# Patient Record
Sex: Female | Born: 1975 | Race: White | Hispanic: No | Marital: Married | Smoking: Never smoker
Health system: Southern US, Community
[De-identification: ages and names within clinical notes are randomized; demographics above are authoritative.]

## PROBLEM LIST (undated history)

## (undated) DIAGNOSIS — Z973 Presence of spectacles and contact lenses: Secondary | ICD-10-CM

## (undated) HISTORY — PX: WISDOM TOOTH EXTRACTION: SHX21

## (undated) HISTORY — DX: Presence of spectacles and contact lenses: Z97.3

## (undated) HISTORY — PX: DEBRIDEMENT AND CLOSURE WOUND: SHX5614

---

## 2000-02-07 ENCOUNTER — Other Ambulatory Visit: Admission: RE | Admit: 2000-02-07 | Discharge: 2000-02-07 | Payer: Self-pay | Admitting: Gynecology

## 2001-02-19 ENCOUNTER — Other Ambulatory Visit: Admission: RE | Admit: 2001-02-19 | Discharge: 2001-02-19 | Payer: Self-pay | Admitting: Gynecology

## 2002-03-03 ENCOUNTER — Other Ambulatory Visit: Admission: RE | Admit: 2002-03-03 | Discharge: 2002-03-03 | Payer: Self-pay | Admitting: Gynecology

## 2003-03-17 ENCOUNTER — Other Ambulatory Visit: Admission: RE | Admit: 2003-03-17 | Discharge: 2003-03-17 | Payer: Self-pay | Admitting: Gynecology

## 2004-03-20 ENCOUNTER — Other Ambulatory Visit: Admission: RE | Admit: 2004-03-20 | Discharge: 2004-03-20 | Payer: Self-pay | Admitting: Gynecology

## 2005-03-28 ENCOUNTER — Other Ambulatory Visit: Admission: RE | Admit: 2005-03-28 | Discharge: 2005-03-28 | Payer: Self-pay | Admitting: Gynecology

## 2005-08-21 ENCOUNTER — Other Ambulatory Visit: Admission: RE | Admit: 2005-08-21 | Discharge: 2005-08-21 | Payer: Self-pay | Admitting: Gynecology

## 2006-03-19 ENCOUNTER — Other Ambulatory Visit: Admission: RE | Admit: 2006-03-19 | Discharge: 2006-03-19 | Payer: Self-pay | Admitting: Gynecology

## 2006-12-20 ENCOUNTER — Ambulatory Visit (HOSPITAL_COMMUNITY): Admission: RE | Admit: 2006-12-20 | Discharge: 2006-12-20 | Payer: Self-pay | Admitting: Gynecology

## 2007-05-07 ENCOUNTER — Other Ambulatory Visit: Admission: RE | Admit: 2007-05-07 | Discharge: 2007-05-07 | Payer: Self-pay | Admitting: Gynecology

## 2008-02-05 ENCOUNTER — Ambulatory Visit (HOSPITAL_COMMUNITY): Admission: RE | Admit: 2008-02-05 | Discharge: 2008-02-05 | Payer: Self-pay | Admitting: Gynecology

## 2008-06-01 ENCOUNTER — Other Ambulatory Visit: Admission: RE | Admit: 2008-06-01 | Discharge: 2008-06-01 | Payer: Self-pay | Admitting: Gynecology

## 2009-07-06 ENCOUNTER — Inpatient Hospital Stay (HOSPITAL_COMMUNITY): Admission: AD | Admit: 2009-07-06 | Discharge: 2009-07-09 | Payer: Self-pay | Admitting: Obstetrics & Gynecology

## 2009-07-21 ENCOUNTER — Ambulatory Visit (HOSPITAL_COMMUNITY): Admission: AD | Admit: 2009-07-21 | Discharge: 2009-07-21 | Payer: Self-pay | Admitting: Obstetrics and Gynecology

## 2009-10-20 IMAGING — RF DG HYSTEROGRAM
7 series · 7 of 7 positions shown · non-contrast
Comparison: none

CLINICAL DATA: Infertility.

HYSTEROSALPINGOGRAM
TECHNIQUE: Hysterosalpingogram was performed by the ordering
physician under fluoroscopy.  Fluoroscopic images are submitted for
interpretation following the procedure.
Fluoroscopy time:  1.3 minutes

[Series 1: run · 1 of 1 slices shown (1 of 7)]
[im 1/1]
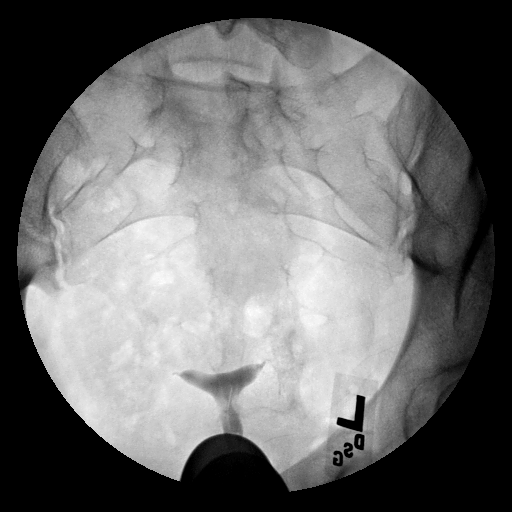

[Series 2: run · 1 of 1 slices shown (2 of 7)]
[im 1/1]
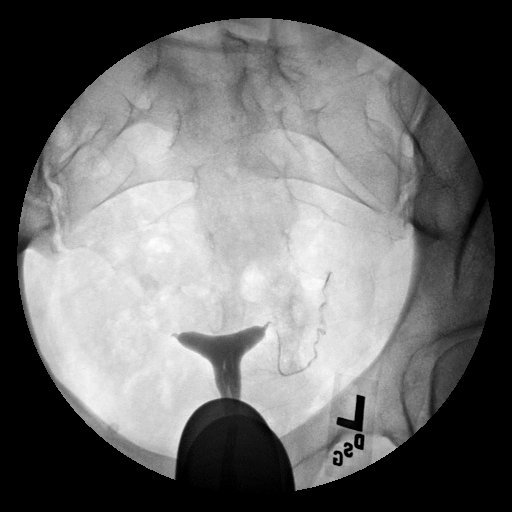

[Series 3: run · 1 of 1 slices shown (3 of 7)]
[im 1/1]
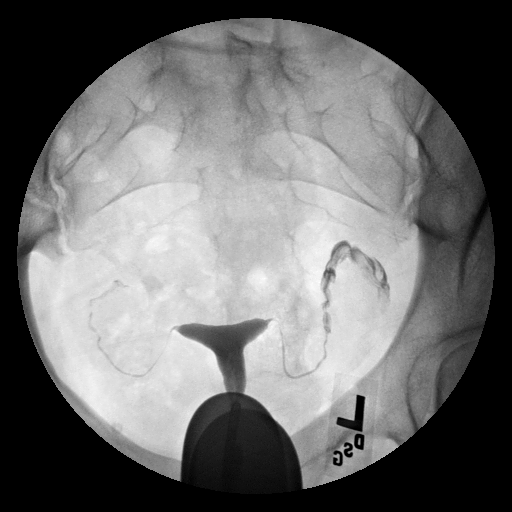

[Series 4: run · 1 of 1 slices shown (4 of 7)]
[im 1/1]
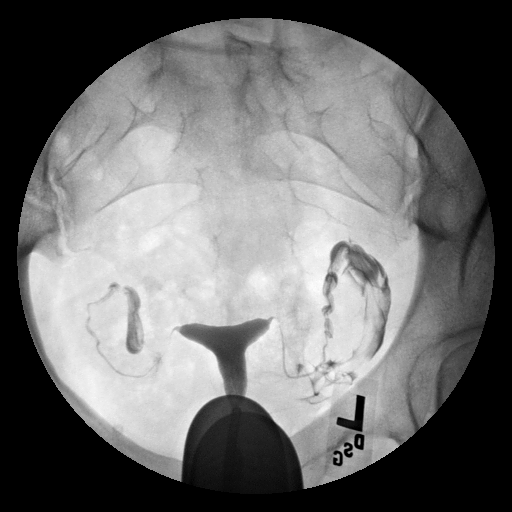

[Series 5: run · 1 of 1 slices shown (5 of 7)]
[im 1/1]
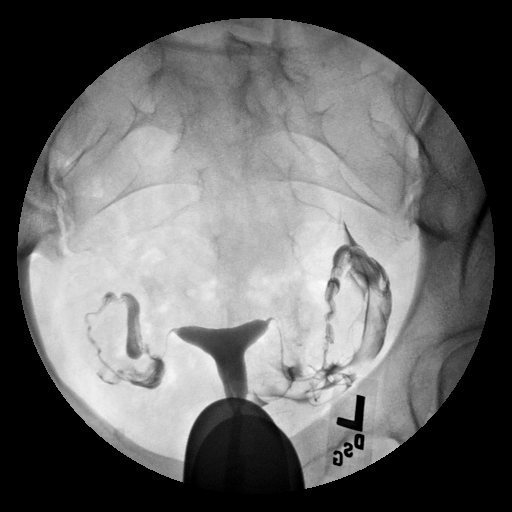

[Series 6: run · 1 of 1 slices shown (6 of 7)]
[im 1/1]
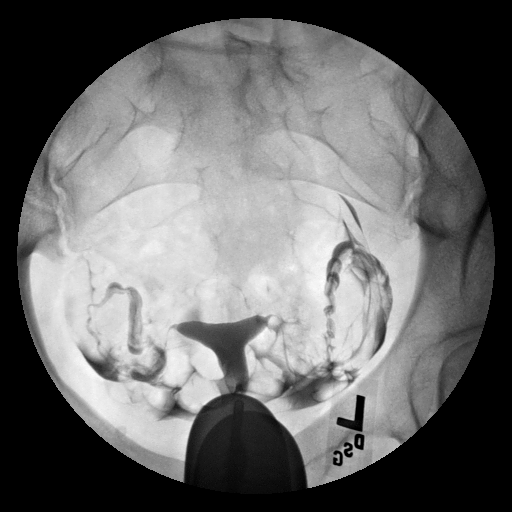

[Series 7: run · 1 of 1 slices shown (7 of 7)]
[im 1/1]
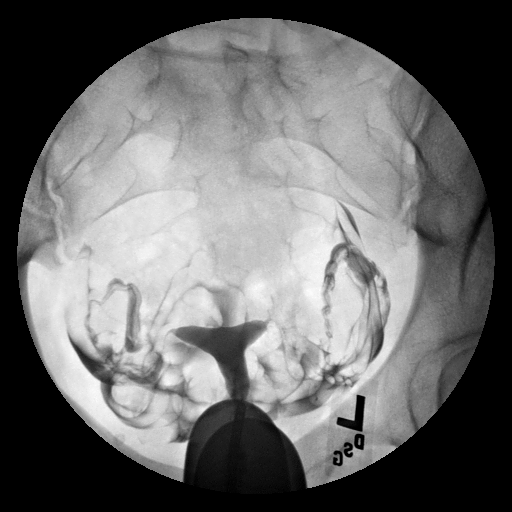

[7 of 7 positions shown; findings below may reference images not displayed]

FINDINGS: A normal endometrial morphology is noted.  Both
fallopian tubes demonstrate a normal morphology and bilateral free
intraperitoneal spill is noted.  No evidence for loculation of
contrast is seen within the pelvis to suggest the presence of
peritubal or periovarian adhesions.
IMPRESSION: Normal HSG.

## 2010-09-17 LAB — CBC
HCT: 36.3 % (ref 36.0–46.0)
Hemoglobin: 12.1 g/dL (ref 12.0–15.0)
MCHC: 34.3 g/dL (ref 30.0–36.0)
MCV: 84.4 fL (ref 78.0–100.0)
RBC: 4.29 MIL/uL (ref 3.87–5.11)
RBC: 4.32 MIL/uL (ref 3.87–5.11)
RDW: 16.6 % — ABNORMAL HIGH (ref 11.5–15.5)
WBC: 11.1 10*3/uL — ABNORMAL HIGH (ref 4.0–10.5)
WBC: 18.6 10*3/uL — ABNORMAL HIGH (ref 4.0–10.5)

## 2010-09-17 LAB — TYPE AND SCREEN: ABO/RH(D): A POS

## 2011-04-03 ENCOUNTER — Encounter: Payer: Self-pay | Admitting: Vascular Surgery

## 2011-04-05 IMAGING — US US PELVIS COMPLETE
1 series · 13 of 25 positions shown · non-contrast
Comparison: None.

CLINICAL DATA: 3 days postpartum; persistent vaginal bleeding.

TRANSABDOMINAL ULTRASOUND OF PELVIS
TECHNIQUE: Transabdominal ultrasound examination of the pelvis was
performed including evaluation of the uterus, ovaries, adnexal
regions, and pelvic cul-de-sac.

[Series 1: us pelvis complete · 0.22mm/px · 13 of 39 slices shown]
[im 1/39]
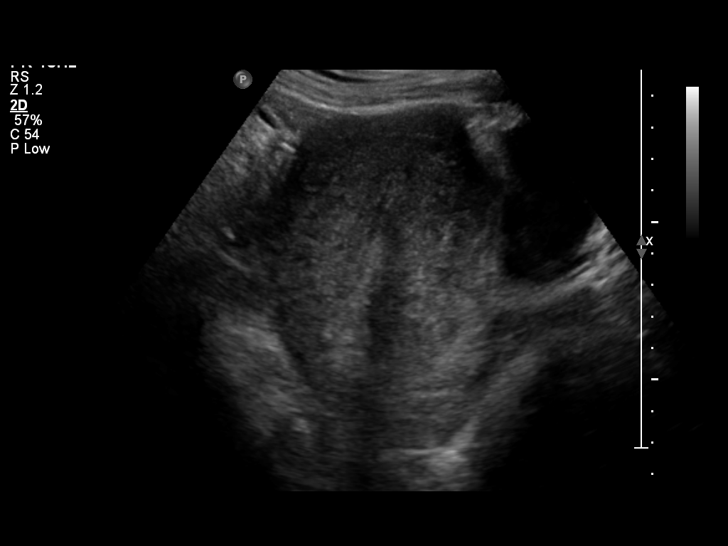
[im 4/39]
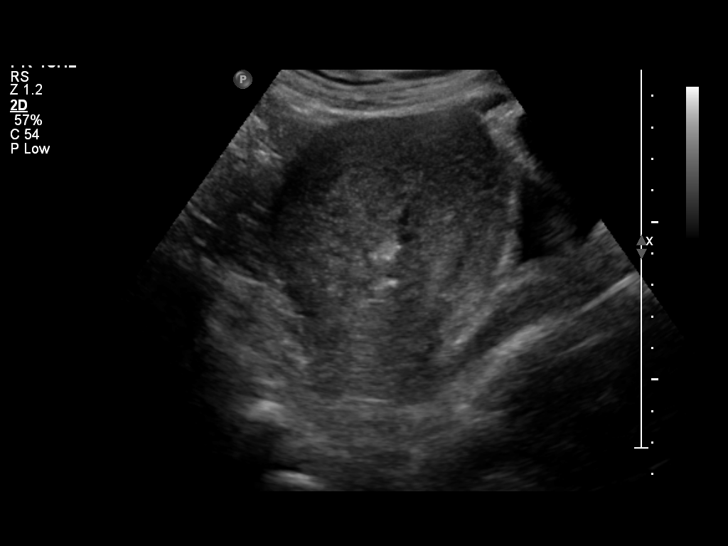
[im 7/39]
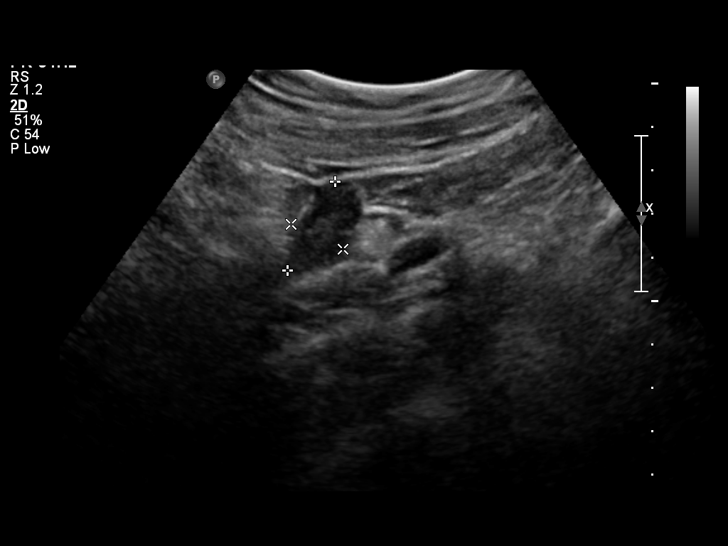
[im 10/39]
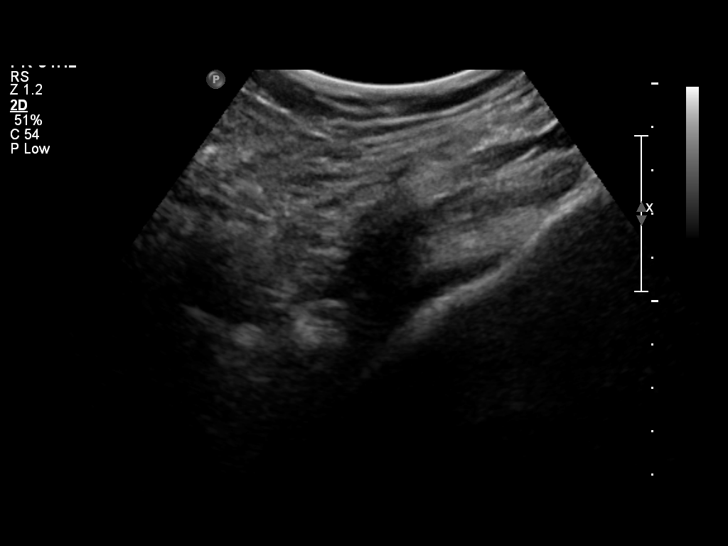
[im 13/39]
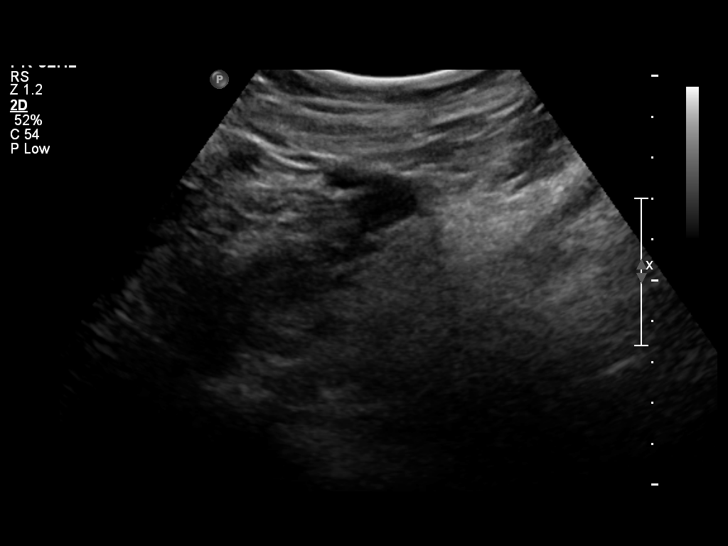
[im 16/39]
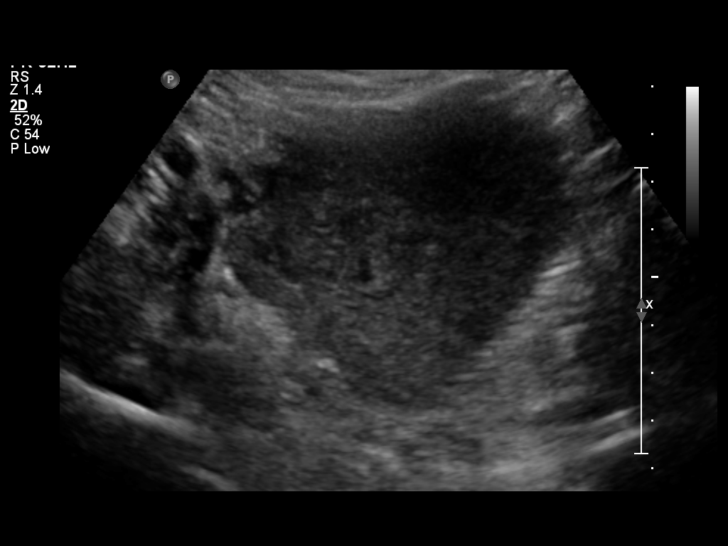
[im 20/39]
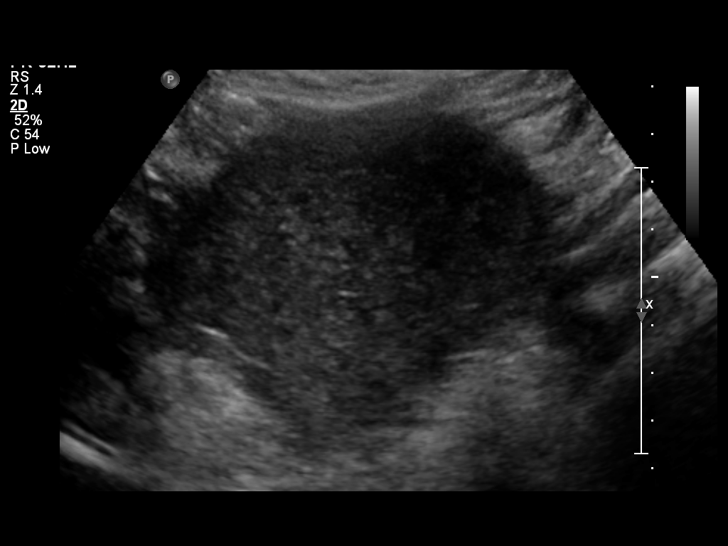
[im 23/39]
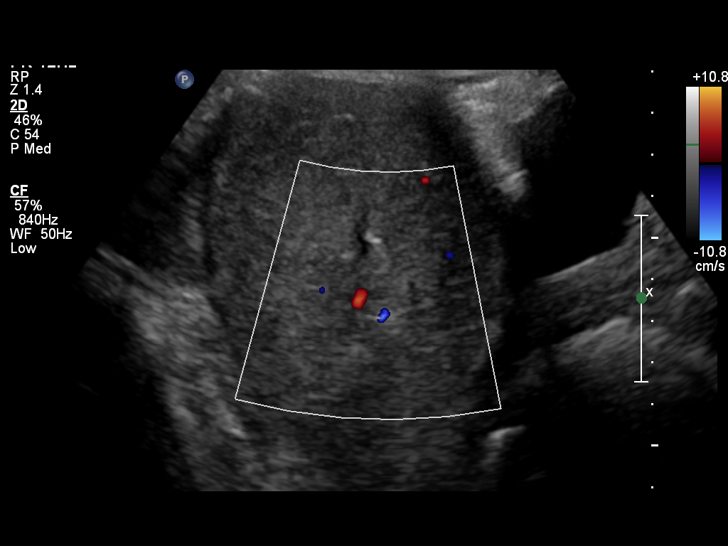
[im 26/39]
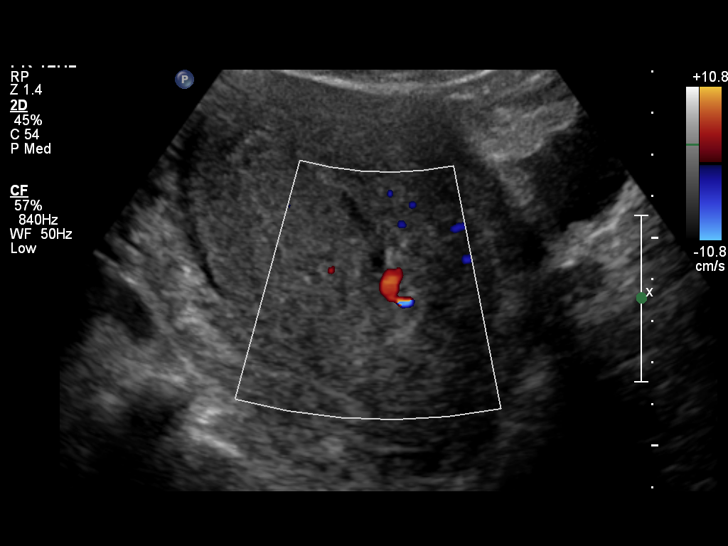
[im 29/39]
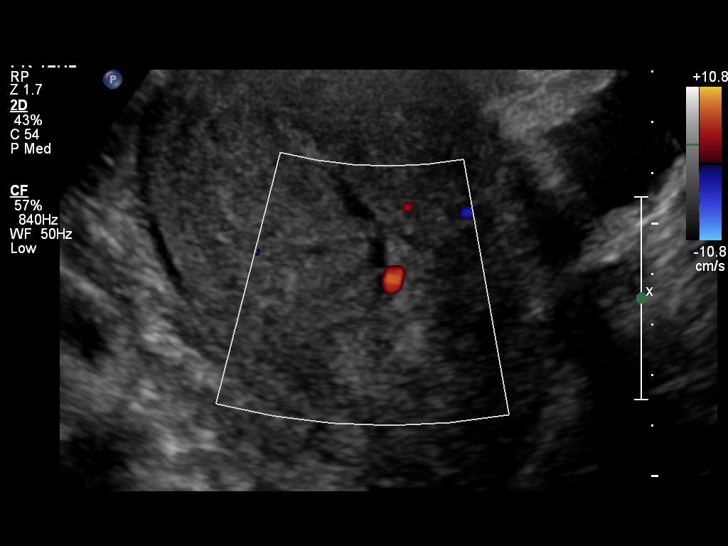
[im 32/39]
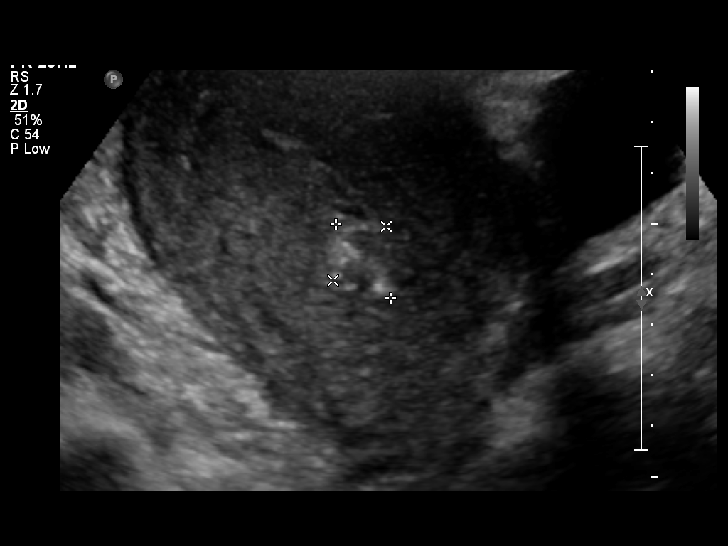
[im 35/39]
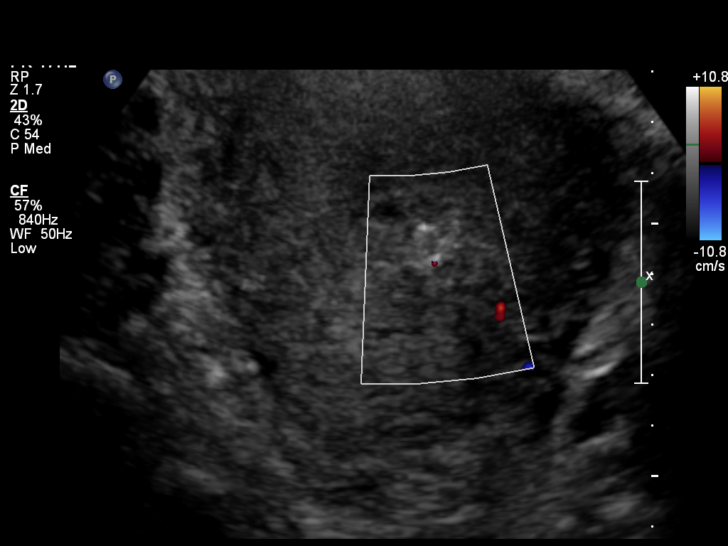
[im 39/39]
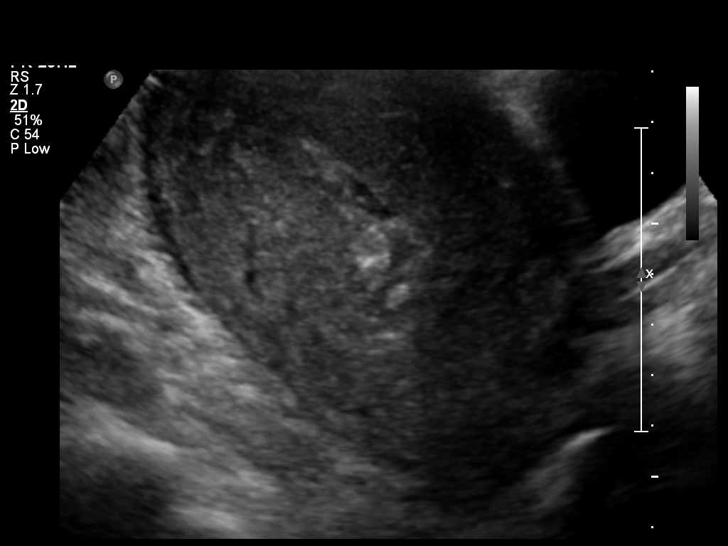

[13 of 25 positions shown; findings below may reference images not displayed]

FINDINGS: The uterus is diffusely enlarged, reflecting the patient's
postpartum state. It measures 10.5 cm in length, 7.2 cm in AP
dimension and 7.2 cm in transverse dimension.  Within the lower
body of the uterus, there is a heterogeneous mildly hyperechoic
mass measuring 1.8 x 1.5 x 1.4 cm, with associated blood flow,
compatible with retained products of conception.

The endometrial canal is otherwise unremarkable in appearance.  No
free fluid is seen within the endometrial canal or cervix.

The ovaries are unremarkable in appearance.  The right ovary
measures 2.7 x 1.2 x 0.9 cm, while the left ovary measures 2.3 x
1.3 x 1.1 cm.

A trace amount of free fluid is noted within the pelvic cul-de-sac.
IMPRESSION: 1.  Small amount of retained products of conception noted within
the body of the uterus, measuring 1.8 x 1.5 x 1.4 cm, with
associated blood flow.
2.  Otherwise unremarkable pelvic ultrasound.

[facility]

## 2011-04-18 LAB — HEMOGLOBINOPATHY EVALUATION
Hemoglobin Other: 0 (ref 0.0–0.0)
Hgb A: 97.6 %
Hgb F Quant: 0 (ref 0.0–2.0)
Hgb S Quant: 0 % (ref 0.0–0.0)

## 2011-04-18 LAB — DIFFERENTIAL
Basophils Absolute: 0
Eosinophils Absolute: 0.1
Eosinophils Relative: 1
Lymphocytes Relative: 24
Lymphs Abs: 2.1
Monocytes Absolute: 0.7
Neutro Abs: 5.9
Neutrophils Relative %: 67

## 2011-04-18 LAB — CBC
HCT: 45
MCHC: 33.4
MCV: 84.9
WBC: 8.7

## 2011-06-13 ENCOUNTER — Ambulatory Visit (INDEPENDENT_AMBULATORY_CARE_PROVIDER_SITE_OTHER): Payer: BC Managed Care – PPO | Admitting: *Deleted

## 2011-06-13 DIAGNOSIS — I781 Nevus, non-neoplastic: Secondary | ICD-10-CM

## 2011-06-13 DIAGNOSIS — I83893 Varicose veins of bilateral lower extremities with other complications: Secondary | ICD-10-CM

## 2011-06-13 NOTE — Progress Notes (Signed)
Addended by: Clementeen Hoof on: 06/13/2011 02:49 PM   Modules accepted: Orders

## 2011-06-13 NOTE — Progress Notes (Signed)
X=.3% Sotradecol administered with a 27g butterfly.  Patient received a total of 3cc.  Cutaneous Laser:pulsed mode  2.8 watts 30 secs. .3 pulses   Total pulses: 161 Total energy 444  Total time:.04  Photos:no  Compression stockings applied: yes  Treated her tiny spiders. Tol well even though she "hates needles". Antic. Good results. Will follow as needed.

## 2011-11-20 ENCOUNTER — Encounter: Payer: Self-pay | Admitting: *Deleted

## 2011-11-21 ENCOUNTER — Ambulatory Visit: Payer: BC Managed Care – PPO | Admitting: *Deleted

## 2011-11-27 ENCOUNTER — Encounter: Payer: Self-pay | Admitting: *Deleted

## 2011-11-28 ENCOUNTER — Encounter: Payer: Self-pay | Admitting: *Deleted

## 2011-11-28 ENCOUNTER — Encounter: Payer: Self-pay | Admitting: Vascular Surgery

## 2011-11-28 ENCOUNTER — Ambulatory Visit (INDEPENDENT_AMBULATORY_CARE_PROVIDER_SITE_OTHER): Payer: BC Managed Care – PPO | Admitting: *Deleted

## 2011-11-28 DIAGNOSIS — I781 Nevus, non-neoplastic: Secondary | ICD-10-CM

## 2011-11-28 NOTE — Progress Notes (Signed)
X=.3& Sotradecol administered with a 27g butterfly.  Patient received a total of 4cc.  Pt's veins are tiny and difficult to inject. Encouraged her to wait next time until they are larger and so an entire syringe can be used. Ones previously injected are resolving. Will follow prn.  Photos: no time, she was late  Compression stockings applied: yes

## 2012-11-26 ENCOUNTER — Encounter: Payer: Self-pay | Admitting: Medical

## 2012-11-26 ENCOUNTER — Ambulatory Visit (INDEPENDENT_AMBULATORY_CARE_PROVIDER_SITE_OTHER): Payer: BC Managed Care – PPO | Admitting: Medical

## 2012-11-26 VITALS — BP 98/70 | HR 62 | Temp 98.1°F | Resp 16 | Wt 151.0 lb

## 2012-11-26 DIAGNOSIS — L259 Unspecified contact dermatitis, unspecified cause: Secondary | ICD-10-CM

## 2012-11-26 DIAGNOSIS — L500 Allergic urticaria: Secondary | ICD-10-CM

## 2012-11-26 MED ORDER — METHYLPREDNISOLONE 4 MG PO KIT: PACK | ORAL | Status: DC

## 2012-11-26 MED ORDER — TRIAMCINOLONE ACETONIDE 0.1 % EX CREA: TOPICAL_CREAM | Freq: Two times a day (BID) | CUTANEOUS | Status: DC

## 2012-11-26 NOTE — Progress Notes (Signed)
Subjective: Here as a new patient today.  Is usually healthy, but notes 6 day hx/o rash.  Only other medical care in last 2 years has been gynecologic exam yearly and urgent care visit 2 years ago for sinusitis.  Current rash started out as few patches of hives on left neck, but over the next few days it has spread to upper chest and right neck.  Had this one other time in remote past related to stress, lasted 5 min, but this has lasted 6 days.  She notes rash only on chest and neck, not on extremities or face.  She has been out in the yard playing with her son, may have rubbed against some limbs/leaves, but denies new soaps, foods, hygiene products, no other new chemical exposures.  Using some benadryl oral for the itching.  No other aggravating or relieving factors.    Past Medical History  Diagnosis Date  . Wears glasses    ROS as in subjective  Objective: Gen: wd, wn, nad Skin; neck bilat with a few linear and round patches of raised whealed rash with erythema, broad flat splotchy reticular erythematous patches a swell.  No other abnormal skin finding.   Assessment: Encounter Diagnoses  Name Primary?  . Contact dermatitis Yes  . Allergic urticaria    Plan: Discussed symptoms, exam findings.   Likely cause is contact dermatitis by plant.  Advised avoidance, begin triamcinolone topically and if worse or not improving in 2 days, begin Medrol Dosepak.  Discussed risks/benefits of steroid use.  Call/return if worse or not improving.

## 2013-12-06 ENCOUNTER — Ambulatory Visit (INDEPENDENT_AMBULATORY_CARE_PROVIDER_SITE_OTHER): Payer: BC Managed Care – PPO | Admitting: Family Medicine

## 2013-12-06 VITALS — BP 110/68 | HR 72 | Temp 98.4°F | Resp 18 | Ht 67.0 in | Wt 140.0 lb

## 2013-12-06 DIAGNOSIS — H109 Unspecified conjunctivitis: Secondary | ICD-10-CM

## 2013-12-06 MED ORDER — CIPROFLOXACIN HCL 0.3 % OP SOLN: OPHTHALMIC | Status: DC

## 2013-12-06 NOTE — Progress Notes (Addendum)
Subjective:    Patient ID: Catherine Oconnor, female    DOB: 1975-08-01, 38 y.o.   MRN: [id number]  Conjunctivitis  Associated symptoms include eye itching, eye discharge and eye redness. Pertinent negatives include no photophobia.   Chief Complaint  Patient presents with  . Conjunctivitis    woke up this morning with matted eyes with redness    This chart was scribed for [name], MD by Andrew Au, ED Scribe. This patient was seen in room 3 and the patient's care was started at 11:03 AM.  HPI Comments: Catherine Oconnor is a 38 y.o. female who presents to the [facility] complaining of left erythematous eye x this morning upon waking. She reports crust to left eye first thing this morning. At this time left eye is watery but states she has avoided touching eye . Pt reports cold symptoms during the week without effecting eyes. States her husband had erythematous eye yesterday. She denies visual disturbances. Pt states she only wears glasses while driving. She does not use eye drops. She denies any foreign body to left eye. Pt is allergic to penicillins. NKI.   Pt is an Conservation officer, [employer]   Right eye Left eye Both eyes  Without correction:     With correction: 20/15 20/20 20/15     Patient Active Problem List   Diagnosis Date Noted  . 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 Wears glasses    Past Surgical History  Procedure Laterality Date  . Wisdom tooth extraction    . Debridement and closure wound     Allergies  Allergen Reactions  . Penicillins    Prior to Admission medications   Medication Sig Start Date End Date Taking? Authorizing Provider  methylPREDNISolone (MEDROL, PAK,) 4 MG tablet follow package directions 11/26/12   Kermit Balo Tysinger, PA-C  triamcinolone cream (KENALOG) 0.1 % Apply topically 2 (two) times daily. 11/26/12   Jac Canavan, PA-C   History   Social History  . Marital Status:  Married    Spouse Name: N/A    Number of Children: N/A  . Years of Education: N/A   Occupational History  . Not on file.   Social History Main Topics  . Smoking status: Never Smoker   . Smokeless tobacco: Not on file  . Alcohol Use: Not on file  . Drug Use: Not on file  . Sexual Activity: Not on file   Other Topics Concern  . 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 Negative for photophobia and visual disturbance.  Genitourinary: Negative for dysuria.      Objective:   Physical Exam  Vitals reviewed. Constitutional: She is oriented to person, place, and time. She appears well-developed and well-nourished. No distress.  HENT:  Head: Normocephalic and atraumatic.  Right Ear: Hearing, tympanic membrane, external ear and ear canal normal.  Left Ear: Hearing, tympanic membrane, external ear and ear canal normal.  Nose: Nose normal.  Mouth/Throat: Oropharynx is clear and moist. No oropharyngeal exudate.  Eyes: Conjunctivae and EOM are normal. Pupils are equal, round, and reactive to light. Left eye exhibits chemosis ( medial greater than lateral).  On medial canthus there is a small amount of exudate. There is no exudate present on the eyelids.   Neck: No thyromegaly present.  Cardiovascular: Normal  rate, regular rhythm, normal heart sounds and intact distal pulses.   No murmur heard. Pulmonary/Chest: Effort normal and breath sounds normal. No respiratory distress. She has no wheezes. She has no rhonchi.  Lymphadenopathy:       Head (right side): No preauricular adenopathy present.       Head (left side): No preauricular adenopathy present.  Neurological: She is alert and oriented to person, place, and time.  Skin: Skin is warm and dry. No rash noted.  Psychiatric: She has a normal mood and affect. Her behavior is normal.       Assessment & Plan:   [name] is a 38  y.o. female Conjunctivitis, left eye - Plan: ciprofloxacin (CILOXAN) 0.3 % ophthalmic solution  Prior URI, sick contact with suspected viral conjunctivitis. Suspect same in her L eye, vision ok. No significant matting at this point, sx care and contact precautions at present, but if increased sx's or daytime exudate - start Ciloxan gtts. - Rx printed. SED, rtc precautions.   Meds ordered this encounter  Medications  . ciprofloxacin (CILOXAN) 0.3 % ophthalmic solution    Sig: Administer 1 drop, every 2 hours, while awake, for 2 days. Then 1 drop, every 4 hours, while awake, for the next 5 days.    Dispense:  5 mL    Refill:  0   Patient Instructions  Your pink eye appears to be due to virus at this time, but if any increased discharge today or in am - start antibiotic as discussed. Hand washing to lessen chance of giving this to others. Return to the clinic or go to the nearest emergency room if any of your symptoms worsen or new symptoms occur. Conjunctivitis Conjunctivitis is commonly called "pink eye." Conjunctivitis can be caused by bacterial or viral infection, allergies, or injuries. There is usually redness of the lining of the eye, itching, discomfort, and sometimes discharge. There may be deposits of matter along the eyelids. A viral infection usually causes a watery discharge, while a bacterial infection causes a yellowish, thick discharge. Pink eye is very contagious and spreads by direct contact. You may be given antibiotic eyedrops as part of your treatment. Before using your eye medicine, remove all drainage from the eye by washing gently with warm water and cotton balls. Continue to use the medication until you have awakened 2 mornings in a row without discharge from the eye. Do not rub your eye. This increases the irritation and helps spread infection. Use separate towels from other household members. Wash your hands with soap and water before and after touching your eyes. Use cold  compresses to reduce pain and sunglasses to relieve irritation from light. Do not wear contact lenses or wear eye makeup until the infection is gone. SEEK MEDICAL CARE IF:   Your symptoms are not better after 3 days of treatment.  You have increased pain or trouble seeing.  The outer eyelids become very red or swollen. Document Released: 07/26/2004 Document Revised: 09/10/2011 Document Reviewed: 06/18/2005 [facility] Patient Information 2014 [place].     I personally performed the services described in this documentation, which was scribed in my presence. The recorded information has been reviewed and considered, and addended by me as needed.

## 2013-12-06 NOTE — Patient Instructions (Signed)
Your pink eye appears to be due to virus at this time, but if any increased discharge today or in am - start antibiotic as discussed. Hand washing to lessen chance of giving this to others. Return to the clinic or go to the nearest emergency room if any of your symptoms worsen or new symptoms occur. Conjunctivitis Conjunctivitis is commonly called "pink eye." Conjunctivitis can be caused by bacterial or viral infection, allergies, or injuries. There is usually redness of the lining of the eye, itching, discomfort, and sometimes discharge. There may be deposits of matter along the eyelids. A viral infection usually causes a watery discharge, while a bacterial infection causes a yellowish, thick discharge. Pink eye is very contagious and spreads by direct contact. You may be given antibiotic eyedrops as part of your treatment. Before using your eye medicine, remove all drainage from the eye by washing gently with warm water and cotton balls. Continue to use the medication until you have awakened 2 mornings in a row without discharge from the eye. Do not rub your eye. This increases the irritation and helps spread infection. Use separate towels from other household members. Wash your hands with soap and water before and after touching your eyes. Use cold compresses to reduce pain and sunglasses to relieve irritation from light. Do not wear contact lenses or wear eye makeup until the infection is gone. SEEK MEDICAL CARE IF:   Your symptoms are not better after 3 days of treatment.  You have increased pain or trouble seeing.  The outer eyelids become very red or swollen. Document Released: 07/26/2004 Document Revised: 09/10/2011 Document Reviewed: 06/18/2005 [facility] Patient Information 2014 [place], [place].

## 2014-05-19 ENCOUNTER — Ambulatory Visit (INDEPENDENT_AMBULATORY_CARE_PROVIDER_SITE_OTHER): Payer: BC Managed Care – PPO | Admitting: Family Medicine

## 2014-05-19 VITALS — BP 90/62 | HR 109 | Temp 99.6°F | Resp 16 | Ht 67.0 in | Wt 136.2 lb

## 2014-05-19 DIAGNOSIS — R509 Fever, unspecified: Secondary | ICD-10-CM

## 2014-05-19 DIAGNOSIS — J988 Other specified respiratory disorders: Secondary | ICD-10-CM

## 2014-05-19 LAB — POCT INFLUENZA A/B
INFLUENZA A, POC: NEGATIVE
INFLUENZA B, POC: NEGATIVE

## 2014-05-19 MED ORDER — GUAIFENESIN ER 1200 MG PO TB12
1.0000 | ORAL_TABLET | Freq: Two times a day (BID) | ORAL | Status: AC | PRN
Start: 2014-05-19 — End: ?

## 2014-05-19 MED ORDER — AZITHROMYCIN 500 MG PO TABS: 500.0000 mg | ORAL_TABLET | Freq: Every day | ORAL | Status: DC

## 2014-05-19 NOTE — Patient Instructions (Signed)
If your symptoms are not improving within the next 24-48 hours, please let me know and we will try to reevaluate you. Take 2-3 over-the-counter ibuprofen with food for fever and aches, do not exceed 3 doses in 1 day.

## 2014-05-19 NOTE — Progress Notes (Signed)
MRN: [id number] DOB: Oct 04, 1975  Subjective:   Catherine HarrisonKaren Oconnor is a 38 y.o. female presenting for 2 day history mailaise. Associated symptoms include fevers, chills, cough. Yesterday felt feverish, today took her temperature was 102.75F this am, had myalgias of legs and back, took Advil which helped with fever, used cold packs, fever came down to normal but then had chills 5 hours later. Patient took and Advil ~15:00 and her temp and was 103.23F at ~16:00 despite Advil dose, still feeling chills. Cough is dry not worse at night or morning, just intermittent throughout the day, feels pressure/pain in her head with cough. ROS as below. Generally healthy. Brother was sick 2 weeks ago, still symptomatic with similar symptoms. Father had a stroke 03/2014, has been in and out of the hospital, patient has been visiting patient. Denies history of allergies or asthma. No smoking, occasional glass of wine.  Denies any other aggravating or relieving factors, no other questions or concerns.  No current medications.  Allergies  Allergen Reactions  . Penicillins     Past Medical History  Diagnosis Date  . Wears glasses     Past Surgical History  Procedure Laterality Date  . Wisdom tooth extraction    . Debridement and closure wound      Review of Systems  Constitutional: Positive for fever, chills and malaise/fatigue. Negative for weight loss and diaphoresis.  HENT: Negative for congestion, ear discharge, ear pain and sore throat.   Eyes: Negative for pain, discharge and redness.  Respiratory: Positive for cough. Negative for hemoptysis, sputum production, shortness of breath and wheezing.   Cardiovascular: Negative for chest pain, palpitations, orthopnea and leg swelling.  Gastrointestinal: Negative for nausea, vomiting, abdominal pain, diarrhea, constipation and blood in stool.  Genitourinary: Negative for dysuria, hematuria and flank pain.  Musculoskeletal: Negative for myalgias, back pain and  neck pain.  Skin: Negative for rash.  Neurological: Negative for dizziness, weakness and headaches.    Objective:   Vitals: BP 90/62 mmHg  Pulse 109  Temp(Src) 99.6 F (37.6 C) (Oral)  Resp 16  Ht 5\' 7"  (1.702 m)  Wt 136 lb 3.2 oz (61.78 kg)  BMI 21.33 kg/m2  SpO2 98%  LMP 04/18/2014  BP Readings from Last 3 Encounters:  05/19/14 90/62  12/06/13 110/68  11/26/12 98/70    Wt Readings from Last 3 Encounters:  05/19/14 136 lb 3.2 oz (61.78 kg)  12/06/13 140 lb (63.504 kg)  11/26/12 151 lb (68.493 kg)   Orthostatic VS for the past 24 hrs:  BP- Lying Pulse- Lying BP- Sitting Pulse- Sitting BP- Standing at 0 minutes Pulse- Standing at 0 minutes  05/19/14 1928 109/58 mmHg 125 109/75 mmHg 131 117/79 mmHg 144    Physical Exam  Constitutional: She is oriented to person, place, and time and well-developed, well-nourished, and in no distress. No distress.  HENT:  Right Ear: External ear normal.  Left Ear: External ear normal.  Nose: Nose normal.  Mouth/Throat: Oropharynx is clear and moist. No oropharyngeal exudate.  Eyes: Conjunctivae are normal. Pupils are equal, round, and reactive to light. Right eye exhibits no discharge. Left eye exhibits no discharge. No scleral icterus.  Neck: Normal range of motion. Neck supple.  Cardiovascular: Regular rhythm, normal heart sounds and intact distal pulses.  Tachycardia present.  Exam reveals no gallop and no friction rub.   No murmur heard. Pulmonary/Chest: Effort normal and breath sounds normal. No stridor. No respiratory distress. She has no wheezes. She has no rales.  Abdominal: Soft. Bowel sounds are normal. She exhibits no distension and no mass. There is no tenderness.  Musculoskeletal: Normal range of motion. She exhibits no edema or tenderness.  Lymphadenopathy:    She has no cervical adenopathy.  Neurological: She is alert and oriented to person, place, and time.  Skin: Skin is warm and dry. No rash noted. She is not  diaphoretic. No erythema.  Psychiatric: Affect normal.   Results for orders placed or performed in visit on 05/19/14 (from the past 24 hour(s))  POCT Influenza A/B     Status: None   Collection Time: 05/19/14  7:24 PM  Result Value Ref Range   Influenza A, POC Negative    Influenza B, POC Negative     Assessment and Plan :   1. Fever, unspecified fever cause 2. Respiratory infection - Flu swab negative, will treat empirically with Macrolide, supportive care advised, if no improvement in 24-48 hours or if symptoms worsen, please call or return to clinic for reevaluation. - azithromycin (ZITHROMAX) 500 MG tablet; Take 1 tablet (500 mg total) by mouth daily.  Dispense: 3 tablet; Refill: 0 - POCT Influenza A/B - azithromycin (ZITHROMAX) 500 MG tablet; Take 1 tablet (500 mg total) by mouth daily.  Dispense: 3 tablet; Refill: 0 - CBC with Differential   Catherine BambergMario Mani, PA-C [facility] [phone] 05/19/2014 7:56 PM Catherine SidleKurt Shabreka Coulon, Md

## 2014-05-20 ENCOUNTER — Telehealth: Payer: Self-pay | Admitting: *Deleted

## 2014-05-20 ENCOUNTER — Encounter: Payer: Self-pay | Admitting: Urgent Care

## 2014-05-20 LAB — CBC WITH DIFFERENTIAL/PLATELET
BASOS ABS: 0 10*3/uL (ref 0.0–0.1)
Basophils Relative: 0 % (ref 0–1)
EOS PCT: 0 % (ref 0–5)
Eosinophils Absolute: 0 10*3/uL (ref 0.0–0.7)
HEMATOCRIT: 41.1 % (ref 36.0–46.0)
Hemoglobin: 14.5 g/dL (ref 12.0–15.0)
LYMPHS ABS: 1 10*3/uL (ref 0.7–4.0)
Lymphocytes Relative: 10 % — ABNORMAL LOW (ref 12–46)
MCH: 28.8 pg (ref 26.0–34.0)
MCHC: 35.3 g/dL (ref 30.0–36.0)
MCV: 81.5 fL (ref 78.0–100.0)
MONO ABS: 1 10*3/uL (ref 0.1–1.0)
MONOS PCT: 10 % (ref 3–12)
MPV: 11.7 fL (ref 9.4–12.4)
Neutro Abs: 8.2 10*3/uL — ABNORMAL HIGH (ref 1.7–7.7)
Neutrophils Relative %: 80 % — ABNORMAL HIGH (ref 43–77)
Platelets: 150 10*3/uL (ref 150–400)
RBC: 5.04 MIL/uL (ref 3.87–5.11)
RDW: 13.6 % (ref 11.5–15.5)
WBC: 10.2 10*3/uL (ref 4.0–10.5)

## 2014-05-20 NOTE — Telephone Encounter (Signed)
Pt called to inquire about labs, Please advise ,

## 2014-05-20 NOTE — Telephone Encounter (Signed)
This patient was seen by Gurney MaxinMike Mani.  The results show no influenza, but rather an acute infection which may be viral but also may have a bacterial component.  Recommend finishing medicine as ordered

## 2014-05-20 NOTE — Telephone Encounter (Signed)
Spoke with patient and gave results and Dr Loma BostonLauenstein's notes.  Patient understands.

## 2014-05-30 ENCOUNTER — Ambulatory Visit (INDEPENDENT_AMBULATORY_CARE_PROVIDER_SITE_OTHER): Payer: BC Managed Care – PPO | Admitting: Family Medicine

## 2014-05-30 ENCOUNTER — Ambulatory Visit (INDEPENDENT_AMBULATORY_CARE_PROVIDER_SITE_OTHER): Payer: BC Managed Care – PPO

## 2014-05-30 VITALS — BP 98/66 | HR 106 | Temp 98.9°F | Resp 16 | Ht 66.25 in | Wt 134.0 lb

## 2014-05-30 DIAGNOSIS — R079 Chest pain, unspecified: Secondary | ICD-10-CM

## 2014-05-30 DIAGNOSIS — R05 Cough: Secondary | ICD-10-CM

## 2014-05-30 DIAGNOSIS — R059 Cough, unspecified: Secondary | ICD-10-CM

## 2014-05-30 DIAGNOSIS — R509 Fever, unspecified: Secondary | ICD-10-CM

## 2014-05-30 LAB — POCT CBC
Granulocyte percent: 80.4 %G — AB (ref 37–80)
HCT, POC: 41.2 % (ref 37.7–47.9)
Hemoglobin: 13.5 g/dL (ref 12.2–16.2)
Lymph, poc: 1.5 (ref 0.6–3.4)
MCH, POC: 28 pg (ref 27–31.2)
MCHC: 32.7 g/dL (ref 31.8–35.4)
MCV: 85.7 fL (ref 80–97)
MID (cbc): 1.2 — AB (ref 0–0.9)
MPV: 7.9 fL (ref 0–99.8)
POC Granulocyte: 11 — AB (ref 2–6.9)
POC LYMPH PERCENT: 11.2 %L (ref 10–50)
POC MID %: 8.4 %M (ref 0–12)
Platelet Count, POC: 308 10*3/uL (ref 142–424)
RBC: 4.8 M/uL (ref 4.04–5.48)
RDW, POC: 13.2 %
WBC: 13.7 10*3/uL — AB (ref 4.6–10.2)

## 2014-05-30 MED ORDER — HYDROCODONE-HOMATROPINE 5-1.5 MG/5ML PO SYRP: 5.0000 mL | ORAL_SOLUTION | Freq: Three times a day (TID) | ORAL | Status: AC | PRN

## 2014-05-30 MED ORDER — CEFDINIR 300 MG PO CAPS: 300.0000 mg | ORAL_CAPSULE | Freq: Two times a day (BID) | ORAL | Status: AC

## 2014-05-30 NOTE — Patient Instructions (Signed)

## 2014-05-30 NOTE — Progress Notes (Addendum)
Subjective:    Patient ID: Catherine Oconnor, female    DOB: [birth year], 38 y.o.   MRN: [id number] This chart was scribed for Robyn Haber, MD by Marti Sleigh, Medical Scribe. This patient was seen in Room 2 and the patient's care was started a 3:17 PM.  Chief Complaint  Patient presents with   Cough   Fever   Generalized Body Aches    Mostly legs ache   Chills    HPI HPI Comments: Catherine Oconnor is a 38 y.o. female who presents to [facility] reporting for a follow up after reporting to [facility] on 05/19/2014 for a poorly managed fever with associated myalgias that was largely unresponsive to tylenol and ibuprofen. Pt was prescribed abx at [facility]. Pt states that she took abx, mucinex DM, and tylenol with some relief of sx.   Pt states her fever, chills, and myalgias never fully resolved and then worsened three days ago. Pt complains of associated non-productive cough to the point of vomiting. Pt also endorses the feeling of pressure on chest. Pt has never been diagnosed with asthma. Pt denies stomach pain, arthralgias, urinary hesitancy or urgency.   Pt is a Chief Executive Officer.  Comments from last visit: Catherine Oconnor is a 38 y.o. female presenting for 2 day history mailaise. Associated symptoms include fevers, chills, cough. Yesterday felt feverish, today took her temperature was 102.23F this am, had myalgias of legs and back, took Advil which helped with fever, used cold packs, fever came down to normal but then had chills 5 hours later. Patient took and Advil ~15:00 and her temp and was 103.45F at ~16:00 despite Advil dose, still feeling chills. Cough is dry not worse at night or morning, just intermittent throughout the day, feels pressure/pain in her head with cough. ROS as below. Generally healthy. Brother was sick 2 weeks ago, still symptomatic with similar symptoms. Father had a stroke 03/2014, has been in and out of the hospital, patient has been visiting patient. Denies history of allergies or asthma. No  smoking, occasional glass of wine. Denies any other aggravating or relieving factors, no other questions or concerns.  No current medications.   Review of Systems     Objective:   Physical Exam No acute distress HEENT: Normal oropharynx, normal ears, no sinus tenderness, no facial swelling Neck: Supple no adenopathy or thyromegaly Chest: Clear to auscultation Heart: Regular, no murmur or gallop Skin: Warm and dry without rash  Results for orders placed or performed in visit on 05/19/14  CBC with Differential  Result Value Ref Range   WBC 10.2 4.0 - 10.5 K/uL   RBC 5.04 3.87 - 5.11 MIL/uL   Hemoglobin 14.5 12.0 - 15.0 g/dL   HCT 41.1 36.0 - 46.0 %   MCV 81.5 78.0 - 100.0 fL   MCH 28.8 26.0 - 34.0 pg   MCHC 35.3 30.0 - 36.0 g/dL   RDW 13.6 11.5 - 15.5 %   Platelets 150 150 - 400 K/uL   MPV 11.7 9.4 - 12.4 fL   Neutrophils Relative % 80 (H) 43 - 77 %   Neutro Abs 8.2 (H) 1.7 - 7.7 K/uL   Lymphocytes Relative 10 (L) 12 - 46 %   Lymphs Abs 1.0 0.7 - 4.0 K/uL   Monocytes Relative 10 3 - 12 %   Monocytes Absolute 1.0 0.1 - 1.0 K/uL   Eosinophils Relative 0 0 - 5 %   Eosinophils Absolute 0.0 0.0 - 0.7 K/uL   Basophils Relative 0 0 -  1 %   Basophils Absolute 0.0 0.0 - 0.1 K/uL   Smear Review Criteria for review not met   POCT Influenza A/B  Result Value Ref Range   Influenza A, POC Negative    Influenza B, POC Negative    Results for orders placed or performed in visit on 05/30/14  POCT CBC  Result Value Ref Range   WBC 13.7 (A) 4.6 - 10.2 K/uL   Lymph, poc 1.5 0.6 - 3.4   POC LYMPH PERCENT 11.2 10 - 50 %L   MID (cbc) 1.2 (A) 0 - 0.9   POC MID % 8.4 0 - 12 %M   POC Granulocyte 11.0 (A) 2 - 6.9   Granulocyte percent 80.4 (A) 37 - 80 %G   RBC 4.80 4.04 - 5.48 M/uL   Hemoglobin 13.5 12.2 - 16.2 g/dL   HCT, POC 41.2 37.7 - 47.9 %   MCV 85.7 80 - 97 fL   MCH, POC 28.0 27 - 31.2 pg   MCHC 32.7 31.8 - 35.4 g/dL   RDW, POC 13.2 %   Platelet Count, POC 308 142 - 424 K/uL    MPV 7.9 0 - 99.8 fL   [facility] reading (PRIMARY) by  Dr. Joseph Art:  CXR. Hazy LLL c/w infiltrate     Assessment & Plan:  This chart was scribed in my presence and reviewed by me personally. Cough - Plan: POCT CBC, DG Chest 2 View, cefdinir (OMNICEF) 300 MG capsule, HYDROcodone-homatropine (HYCODAN) 5-1.5 MG/5ML syrup  Fever, unspecified fever cause - Plan: POCT CBC, DG Chest 2 View, cefdinir (OMNICEF) 300 MG capsule  Chest pain, unspecified chest pain type - Plan: DG Chest 2 View, cefdinir (OMNICEF) 300 MG capsule  Signed, Robyn Haber, MD

## 2016-02-12 IMAGING — CR DG CHEST 2V
2 series · 2 of 2 positions shown · non-contrast
Comparison: None.

CLINICAL DATA: Cough R05 (U3G-YN-CM)Fever, unspecified fever cause
1JW.J (U3G-YN-CM)Chest pain, unspecified chest pain type
(U3G-YN-CM)

EXAM:
CHEST  2 VIEW

[PA]
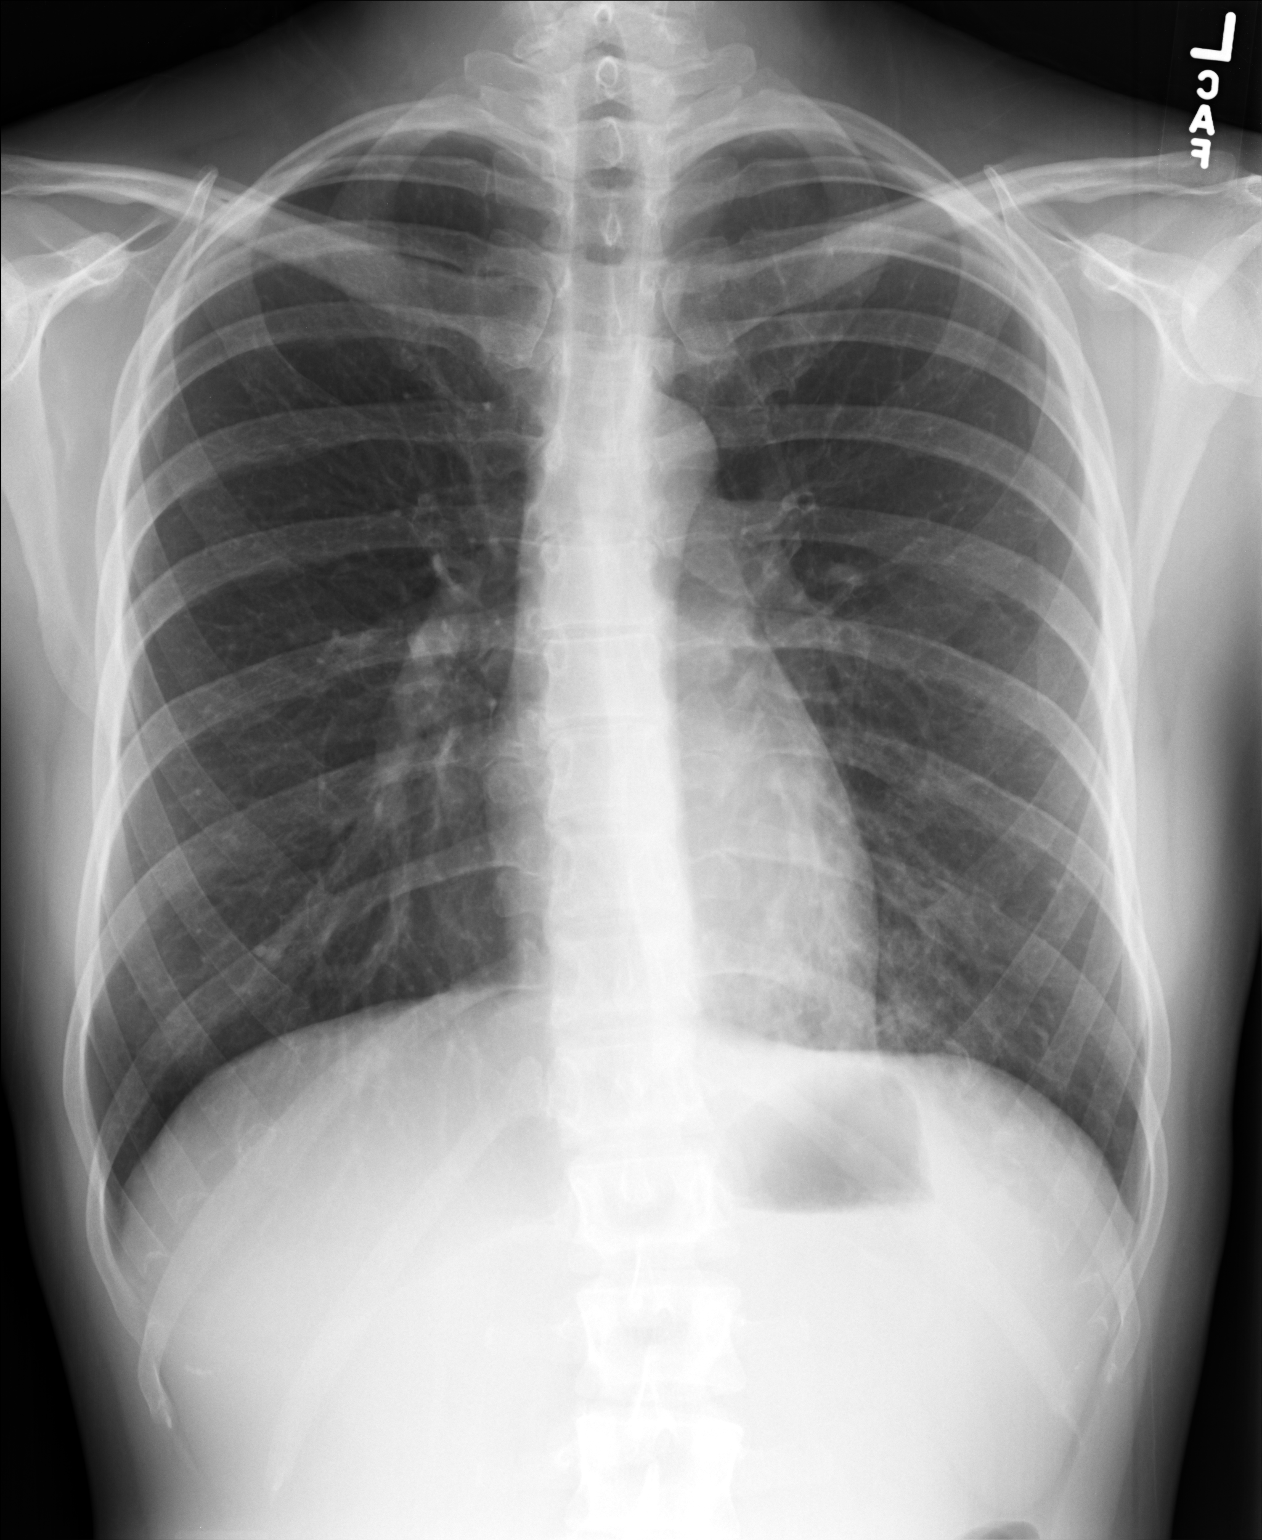

[lateral]
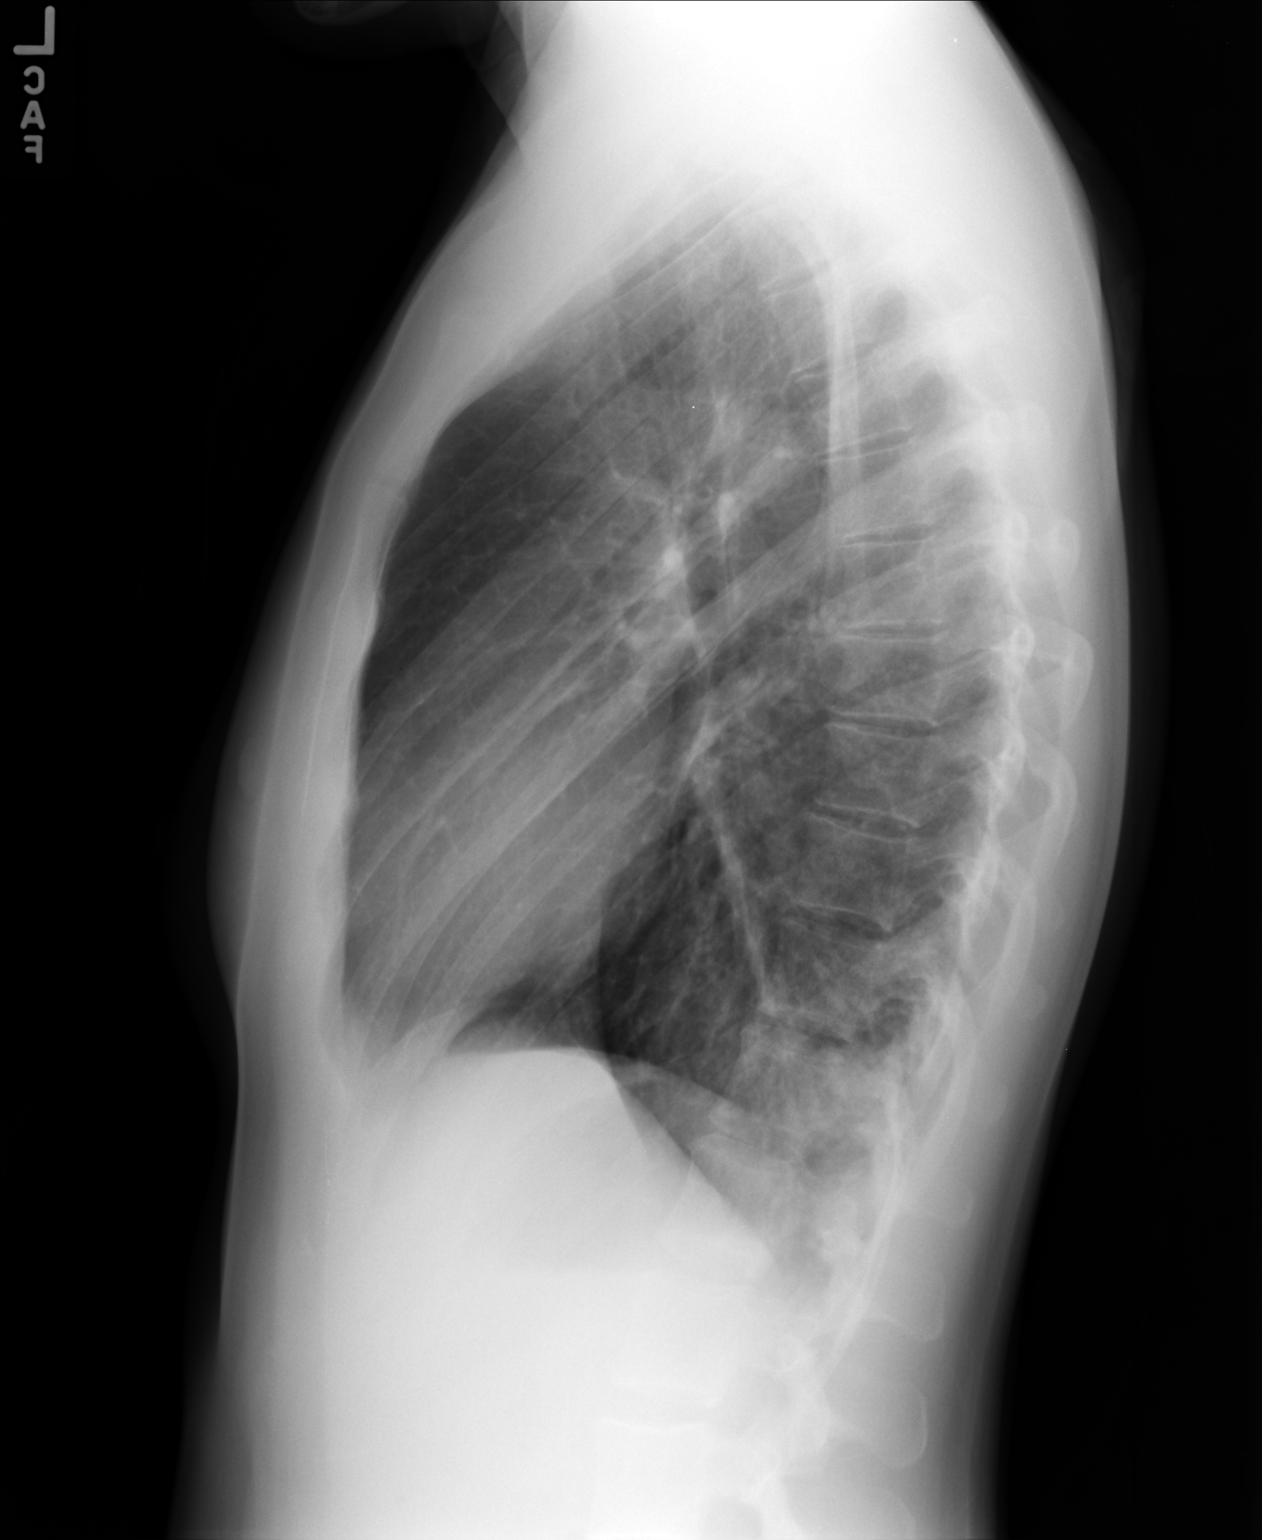

[2 of 2 positions shown; findings below may reference images not displayed]

FINDINGS: Patchy densities in the left lower lobe. The right lung is clear.
Heart and mediastinum are within normal limits. The trachea is
midline. No significant pleural effusions.
IMPRESSION: Patchy densities in the left lower lobe are most compatible with
pneumonia. Recommend follow up to ensure resolution.

## 2019-09-03 ENCOUNTER — Ambulatory Visit: Payer: Self-pay | Attending: Internal Medicine

## 2019-09-03 DIAGNOSIS — Z23 Encounter for immunization: Secondary | ICD-10-CM | POA: Insufficient documentation

## 2019-09-03 NOTE — Progress Notes (Signed)
   [facility]ov[facility] Vaccination [facility]  Name:  Catherine Oconnor    MRN: [id number] DOB: 05/22/1976  09/03/2019  Ms. Son was observed post Covid-19 immunization for 15 minutes without incident. She was provided with Vaccine Information Sheet and instruction to access the V-Safe system.   [name] was instructed to call 911 with any severe reactions post vaccine: Marland Kitchen Difficulty breathing  . Swelling of face and throat  . A fast heartbeat  . A bad rash all over body  . Dizziness and weakness

## 2019-09-30 ENCOUNTER — Ambulatory Visit: Payer: Self-pay | Attending: Internal Medicine

## 2019-09-30 DIAGNOSIS — Z23 Encounter for immunization: Secondary | ICD-10-CM

## 2019-09-30 NOTE — Progress Notes (Signed)
   Covid-19 Vaccination Clinic  Name:  Catherine Oconnor    MRN: [id number] DOB: 1976-01-21  09/30/2019  Catherine Oconnor was observed post Covid-19 immunization for 30 minutes based on pre-vaccination screening without incident. She was provided with Vaccine Information Sheet and instruction to access the V-Safe system.   Catherine Oconnor was instructed to call 911 with any severe reactions post vaccine: Marland Kitchen Difficulty breathing  . Swelling of face and throat  . A fast heartbeat  . A bad rash all over body  . Dizziness and weakness   Immunizations Administered    Name Date Dose VIS Date Route   Pfizer COVID-19 Vaccine 09/30/2019  3:40 PM 0.3 mL 06/12/2019 Intramuscular   Manufacturer: [employer], [employer]   Lot: [id number]   NDC: [address]-7
